# Patient Record
Sex: Female | Born: 1935 | Race: White | Hispanic: No | Marital: Married | Smoking: Never smoker
Health system: Southern US, Community
[De-identification: ages and names within clinical notes are randomized; demographics above are authoritative.]

## PROBLEM LIST (undated history)

## (undated) DIAGNOSIS — E7800 Pure hypercholesterolemia, unspecified: Secondary | ICD-10-CM

## (undated) DIAGNOSIS — F419 Anxiety disorder, unspecified: Secondary | ICD-10-CM

## (undated) DIAGNOSIS — I4891 Unspecified atrial fibrillation: Secondary | ICD-10-CM

## (undated) DIAGNOSIS — K219 Gastro-esophageal reflux disease without esophagitis: Secondary | ICD-10-CM

## (undated) DIAGNOSIS — G309 Alzheimer's disease, unspecified: Secondary | ICD-10-CM

## (undated) DIAGNOSIS — H539 Unspecified visual disturbance: Secondary | ICD-10-CM

## (undated) DIAGNOSIS — I10 Essential (primary) hypertension: Secondary | ICD-10-CM

## (undated) DIAGNOSIS — F0280 Dementia in other diseases classified elsewhere without behavioral disturbance: Secondary | ICD-10-CM

## (undated) HISTORY — PX: ABDOMINAL HYSTERECTOMY: SHX81

## (undated) HISTORY — PX: CATARACT EXTRACTION, BILATERAL: SHX1313

## (undated) HISTORY — DX: Unspecified visual disturbance: H53.9

## (undated) HISTORY — PX: TUBAL LIGATION: SHX77

## (undated) HISTORY — PX: BACK SURGERY: SHX140

---

## 2010-09-23 DIAGNOSIS — R011 Cardiac murmur, unspecified: Secondary | ICD-10-CM | POA: Insufficient documentation

## 2011-11-27 DIAGNOSIS — N281 Cyst of kidney, acquired: Secondary | ICD-10-CM | POA: Insufficient documentation

## 2011-12-14 DIAGNOSIS — M5414 Radiculopathy, thoracic region: Secondary | ICD-10-CM | POA: Insufficient documentation

## 2012-01-23 ENCOUNTER — Encounter (HOSPITAL_BASED_OUTPATIENT_CLINIC_OR_DEPARTMENT_OTHER): Payer: Self-pay | Admitting: Student

## 2012-01-23 ENCOUNTER — Emergency Department (HOSPITAL_BASED_OUTPATIENT_CLINIC_OR_DEPARTMENT_OTHER)
Admission: EM | Admit: 2012-01-23 | Discharge: 2012-01-23 | Disposition: A | Discharge status: Home / Self Care | Payer: Medicare Other | Attending: Emergency Medicine | Admitting: Emergency Medicine

## 2012-01-23 ENCOUNTER — Emergency Department (HOSPITAL_BASED_OUTPATIENT_CLINIC_OR_DEPARTMENT_OTHER): Payer: Medicare Other

## 2012-01-23 DIAGNOSIS — I4891 Unspecified atrial fibrillation: Secondary | ICD-10-CM | POA: Insufficient documentation

## 2012-01-23 DIAGNOSIS — E7800 Pure hypercholesterolemia, unspecified: Secondary | ICD-10-CM | POA: Insufficient documentation

## 2012-01-23 DIAGNOSIS — Y9301 Activity, walking, marching and hiking: Secondary | ICD-10-CM | POA: Insufficient documentation

## 2012-01-23 DIAGNOSIS — W010XXA Fall on same level from slipping, tripping and stumbling without subsequent striking against object, initial encounter: Secondary | ICD-10-CM | POA: Insufficient documentation

## 2012-01-23 DIAGNOSIS — F411 Generalized anxiety disorder: Secondary | ICD-10-CM | POA: Insufficient documentation

## 2012-01-23 DIAGNOSIS — Y92009 Unspecified place in unspecified non-institutional (private) residence as the place of occurrence of the external cause: Secondary | ICD-10-CM | POA: Insufficient documentation

## 2012-01-23 DIAGNOSIS — I10 Essential (primary) hypertension: Secondary | ICD-10-CM | POA: Insufficient documentation

## 2012-01-23 DIAGNOSIS — S81009A Unspecified open wound, unspecified knee, initial encounter: Secondary | ICD-10-CM | POA: Insufficient documentation

## 2012-01-23 DIAGNOSIS — Z23 Encounter for immunization: Secondary | ICD-10-CM | POA: Insufficient documentation

## 2012-01-23 DIAGNOSIS — S60229A Contusion of unspecified hand, initial encounter: Secondary | ICD-10-CM

## 2012-01-23 DIAGNOSIS — S61209A Unspecified open wound of unspecified finger without damage to nail, initial encounter: Secondary | ICD-10-CM | POA: Insufficient documentation

## 2012-01-23 DIAGNOSIS — IMO0002 Reserved for concepts with insufficient information to code with codable children: Secondary | ICD-10-CM

## 2012-01-23 DIAGNOSIS — Z79899 Other long term (current) drug therapy: Secondary | ICD-10-CM | POA: Insufficient documentation

## 2012-01-23 DIAGNOSIS — K219 Gastro-esophageal reflux disease without esophagitis: Secondary | ICD-10-CM | POA: Insufficient documentation

## 2012-01-23 HISTORY — DX: Anxiety disorder, unspecified: F41.9

## 2012-01-23 HISTORY — DX: Unspecified atrial fibrillation: I48.91

## 2012-01-23 HISTORY — DX: Pure hypercholesterolemia, unspecified: E78.00

## 2012-01-23 HISTORY — DX: Essential (primary) hypertension: I10

## 2012-01-23 HISTORY — DX: Gastro-esophageal reflux disease without esophagitis: K21.9

## 2012-01-23 MED ORDER — TETANUS-DIPHTH-ACELL PERTUSSIS 5-2.5-18.5 LF-MCG/0.5 IM SUSP
0.5000 mL | Freq: Once | INTRAMUSCULAR | Status: AC
  Administered 2012-01-23: 0.5 mL via INTRAMUSCULAR
  Filled 2012-01-23: qty 0.5

## 2012-01-23 NOTE — ED Provider Notes (Signed)
History     CSN: [id number]  Arrival date & time 01/23/12  1346   First MD Initiated Contact with Patient 01/23/12 1408      Chief Complaint  Patient presents with  . Hand Pain    right hand    (Consider location/radiation/quality/duration/timing/severity/associated sxs/prior treatment) HPI Comments: Patient presents with pain to her right hand after sustaining a fall 2 days ago. She was walking in her driveway and tripped fell over onto her left side. She did not hit her head. There is no loss of consciousness. She denies any neck or back pain. She complains of some pain to her left hand this been constant throbbing in nature. She also has a skin tear on her left ankle and right knee. She's unsure when her last tetanus shot was.  Patient is a 78 y.o. female presenting with hand pain.  Hand Pain Pertinent negatives include no chest pain, no abdominal pain, no headaches and no shortness of breath.    Past Medical History  Diagnosis Date  . Atrial fibrillation   . Anxiety   . Hypertension   . GERD (gastroesophageal reflux disease)   . Hypercholesteremia     Past Surgical History  Procedure Date  . Abdominal hysterectomy   . Cataract extraction, bilateral   . Tubal ligation     History reviewed. No pertinent family history.  History  Substance Use Topics  . Smoking status: Never Smoker   . Smokeless tobacco: Not on file  . Alcohol Use: No    OB History    Grav Para Term Preterm Abortions TAB SAB Ect Mult Living                  Review of Systems  Constitutional: Negative for fatigue.  HENT: Negative for neck pain.   Respiratory: Negative for shortness of breath.   Cardiovascular: Negative for chest pain.  Gastrointestinal: Negative for nausea, vomiting and abdominal pain.  Musculoskeletal: Negative for back pain and joint swelling.  Skin: Positive for wound.  Neurological: Negative for dizziness and headaches.    Allergies  Demerol and  Penicillins  Home Medications   Current Outpatient Rx  Name Route Sig Dispense Refill  . DILTIAZEM HCL ER BEADS 300 MG PO CP24 Oral Take 300 mg by mouth daily.    Marland Kitchen ESTROGENS CONJUGATED 0.3 MG PO TABS Oral Take 0.3 mg by mouth daily. Take daily for 21 days then do not take for 7 days.    Marland Kitchen HYDROCHLOROTHIAZIDE 25 MG PO TABS Oral Take 25 mg by mouth daily.    Marland Kitchen LORATADINE 10 MG PO TABS Oral Take 10 mg by mouth daily.    Marland Kitchen LORAZEPAM 0.5 MG PO TABS Oral Take 0.5 mg by mouth every 8 (eight) hours.    Marland Kitchen LOSARTAN POTASSIUM 100 MG PO TABS Oral Take 100 mg by mouth daily.    Marland Kitchen PIROXICAM 20 MG PO CAPS Oral Take 20 mg by mouth daily.    Marland Kitchen PRAVASTATIN SODIUM 20 MG PO TABS Oral Take 20 mg by mouth daily.      BP 145/58  Pulse 60  Temp 97.8 F (36.6 C) (Oral)  Resp 18  Wt 150 lb (68.04 kg)  SpO2 99%  Physical Exam  Constitutional: She appears well-developed and well-nourished.  HENT:  Head: Normocephalic.  Mouth/Throat: Oropharynx is clear and moist.  Neck: Normal range of motion. Neck supple.       No pain to the neck or back  Musculoskeletal:  She has some mild tenderness over the fifth metacarpal of the right hand. She has a small 1 cm skin tear over the volar surface of the base of the fifth digit of the right hand. She has a smaller wound over the distal phalanx of the fifth finger. She has also some skin tear overlying the right knee in the left ankle. There is no underlying bony tenderness. The wounds appear to be well healing with no signs of infection    ED Course  Procedures (including critical care time)  No results found for this or any previous visit. Dg Hand Complete Right  01/23/2012  *RADIOLOGY REPORT*  Clinical Data: Laceration post fall.  RIGHT HAND - COMPLETE 3+ VIEW  Comparison: None.  Findings: Mild diffuse osteopenia.  Narrowing of the articular cartilage at the STT articulation.  Negative for fracture, dislocation, or other acute bony abnormality.  Normal  alignment. No radiodense foreign body.  IMPRESSION:  1.  Negative for fracture or other acute bony abnormality. 2.  Osteopenia and STT degenerative changes as above.   Original Report Authenticated By: Osa Craver, M.D.       1. Hand contusion   2. Skin tear       MDM  Patient's tetanus shot was updated. This small wound on the right hand had a skin flap that was trimmed and the wound is redressed. Patient was given wound care and structures was advised to followup with her primary care physician as needed or return here as needed for any worsening symptoms        Rolan Bucco, MD 01/23/12 1443

## 2012-01-23 NOTE — ED Notes (Signed)
Pt in with c/o fall outside of house in drive way while ambulating and reports pain to right hand, right knee and hip pain. Denies LOC at onset and reports

## 2012-03-08 DIAGNOSIS — M5030 Other cervical disc degeneration, unspecified cervical region: Secondary | ICD-10-CM | POA: Insufficient documentation

## 2012-03-08 DIAGNOSIS — M48061 Spinal stenosis, lumbar region without neurogenic claudication: Secondary | ICD-10-CM | POA: Insufficient documentation

## 2012-06-15 DIAGNOSIS — Z9889 Other specified postprocedural states: Secondary | ICD-10-CM | POA: Insufficient documentation

## 2012-09-03 DIAGNOSIS — R5381 Other malaise: Secondary | ICD-10-CM | POA: Insufficient documentation

## 2012-09-03 DIAGNOSIS — M545 Low back pain, unspecified: Secondary | ICD-10-CM | POA: Insufficient documentation

## 2012-09-03 DIAGNOSIS — K5730 Diverticulosis of large intestine without perforation or abscess without bleeding: Secondary | ICD-10-CM | POA: Insufficient documentation

## 2012-09-03 DIAGNOSIS — M1990 Unspecified osteoarthritis, unspecified site: Secondary | ICD-10-CM | POA: Insufficient documentation

## 2012-09-03 DIAGNOSIS — K769 Liver disease, unspecified: Secondary | ICD-10-CM | POA: Insufficient documentation

## 2012-09-03 DIAGNOSIS — R609 Edema, unspecified: Secondary | ICD-10-CM | POA: Insufficient documentation

## 2012-09-03 DIAGNOSIS — R002 Palpitations: Secondary | ICD-10-CM | POA: Insufficient documentation

## 2012-09-03 DIAGNOSIS — F329 Major depressive disorder, single episode, unspecified: Secondary | ICD-10-CM | POA: Insufficient documentation

## 2012-09-03 DIAGNOSIS — H9319 Tinnitus, unspecified ear: Secondary | ICD-10-CM | POA: Insufficient documentation

## 2012-09-03 DIAGNOSIS — M47812 Spondylosis without myelopathy or radiculopathy, cervical region: Secondary | ICD-10-CM | POA: Insufficient documentation

## 2012-09-03 DIAGNOSIS — M47817 Spondylosis without myelopathy or radiculopathy, lumbosacral region: Secondary | ICD-10-CM | POA: Insufficient documentation

## 2012-09-03 DIAGNOSIS — G4710 Hypersomnia, unspecified: Secondary | ICD-10-CM | POA: Insufficient documentation

## 2012-09-03 DIAGNOSIS — K219 Gastro-esophageal reflux disease without esophagitis: Secondary | ICD-10-CM | POA: Insufficient documentation

## 2012-09-03 DIAGNOSIS — R000 Tachycardia, unspecified: Secondary | ICD-10-CM | POA: Insufficient documentation

## 2012-09-03 DIAGNOSIS — M4802 Spinal stenosis, cervical region: Secondary | ICD-10-CM | POA: Insufficient documentation

## 2012-09-03 DIAGNOSIS — E042 Nontoxic multinodular goiter: Secondary | ICD-10-CM | POA: Insufficient documentation

## 2012-09-03 DIAGNOSIS — F32A Depression, unspecified: Secondary | ICD-10-CM | POA: Insufficient documentation

## 2012-09-03 DIAGNOSIS — Z79899 Other long term (current) drug therapy: Secondary | ICD-10-CM | POA: Insufficient documentation

## 2012-09-22 DIAGNOSIS — R209 Unspecified disturbances of skin sensation: Secondary | ICD-10-CM | POA: Insufficient documentation

## 2012-10-28 DIAGNOSIS — G5600 Carpal tunnel syndrome, unspecified upper limb: Secondary | ICD-10-CM | POA: Insufficient documentation

## 2013-01-06 DIAGNOSIS — R413 Other amnesia: Secondary | ICD-10-CM | POA: Insufficient documentation

## 2013-01-06 DIAGNOSIS — N951 Menopausal and female climacteric states: Secondary | ICD-10-CM | POA: Insufficient documentation

## 2013-02-08 DIAGNOSIS — E785 Hyperlipidemia, unspecified: Secondary | ICD-10-CM | POA: Insufficient documentation

## 2013-02-08 DIAGNOSIS — I10 Essential (primary) hypertension: Secondary | ICD-10-CM | POA: Insufficient documentation

## 2013-05-17 DIAGNOSIS — E7800 Pure hypercholesterolemia, unspecified: Secondary | ICD-10-CM | POA: Insufficient documentation

## 2013-05-17 DIAGNOSIS — M51379 Other intervertebral disc degeneration, lumbosacral region without mention of lumbar back pain or lower extremity pain: Secondary | ICD-10-CM | POA: Insufficient documentation

## 2013-05-17 DIAGNOSIS — D179 Benign lipomatous neoplasm, unspecified: Secondary | ICD-10-CM | POA: Insufficient documentation

## 2013-05-17 DIAGNOSIS — I10 Essential (primary) hypertension: Secondary | ICD-10-CM | POA: Insufficient documentation

## 2013-05-17 DIAGNOSIS — M25559 Pain in unspecified hip: Secondary | ICD-10-CM | POA: Insufficient documentation

## 2013-05-17 DIAGNOSIS — D509 Iron deficiency anemia, unspecified: Secondary | ICD-10-CM | POA: Insufficient documentation

## 2013-05-17 DIAGNOSIS — F411 Generalized anxiety disorder: Secondary | ICD-10-CM | POA: Insufficient documentation

## 2013-05-17 DIAGNOSIS — M5137 Other intervertebral disc degeneration, lumbosacral region: Secondary | ICD-10-CM | POA: Insufficient documentation

## 2013-11-03 DIAGNOSIS — R51 Headache: Secondary | ICD-10-CM

## 2013-11-03 DIAGNOSIS — R519 Headache, unspecified: Secondary | ICD-10-CM | POA: Insufficient documentation

## 2013-11-15 DIAGNOSIS — R197 Diarrhea, unspecified: Secondary | ICD-10-CM | POA: Insufficient documentation

## 2013-11-15 DIAGNOSIS — R1031 Right lower quadrant pain: Secondary | ICD-10-CM | POA: Insufficient documentation

## 2013-11-15 DIAGNOSIS — M542 Cervicalgia: Secondary | ICD-10-CM | POA: Insufficient documentation

## 2013-11-15 DIAGNOSIS — L570 Actinic keratosis: Secondary | ICD-10-CM | POA: Insufficient documentation

## 2014-02-06 DIAGNOSIS — K648 Other hemorrhoids: Secondary | ICD-10-CM | POA: Insufficient documentation

## 2014-02-06 DIAGNOSIS — K635 Polyp of colon: Secondary | ICD-10-CM | POA: Insufficient documentation

## 2014-02-07 DIAGNOSIS — N189 Chronic kidney disease, unspecified: Secondary | ICD-10-CM | POA: Insufficient documentation

## 2014-06-06 DIAGNOSIS — R140 Abdominal distension (gaseous): Secondary | ICD-10-CM | POA: Insufficient documentation

## 2014-07-18 DIAGNOSIS — R197 Diarrhea, unspecified: Secondary | ICD-10-CM | POA: Insufficient documentation

## 2014-08-22 DIAGNOSIS — G301 Alzheimer's disease with late onset: Principal | ICD-10-CM

## 2014-08-22 DIAGNOSIS — F0280 Dementia in other diseases classified elsewhere without behavioral disturbance: Secondary | ICD-10-CM | POA: Insufficient documentation

## 2014-08-22 DIAGNOSIS — F0390 Unspecified dementia without behavioral disturbance: Secondary | ICD-10-CM | POA: Insufficient documentation

## 2014-09-06 DIAGNOSIS — K5790 Diverticulosis of intestine, part unspecified, without perforation or abscess without bleeding: Secondary | ICD-10-CM | POA: Insufficient documentation

## 2014-09-06 DIAGNOSIS — K5900 Constipation, unspecified: Secondary | ICD-10-CM | POA: Insufficient documentation

## 2014-09-11 ENCOUNTER — Ambulatory Visit (INDEPENDENT_AMBULATORY_CARE_PROVIDER_SITE_OTHER): Payer: Medicare Other | Admitting: Neurology

## 2014-09-11 ENCOUNTER — Encounter: Payer: Self-pay | Admitting: Neurology

## 2014-09-11 VITALS — BP 170/68 | HR 68 | Resp 16 | Ht 64.0 in | Wt 165.0 lb

## 2014-09-11 DIAGNOSIS — E559 Vitamin D deficiency, unspecified: Secondary | ICD-10-CM | POA: Insufficient documentation

## 2014-09-11 DIAGNOSIS — F0280 Dementia in other diseases classified elsewhere without behavioral disturbance: Secondary | ICD-10-CM

## 2014-09-11 DIAGNOSIS — G301 Alzheimer's disease with late onset: Secondary | ICD-10-CM | POA: Diagnosis not present

## 2014-09-11 DIAGNOSIS — F32A Depression, unspecified: Secondary | ICD-10-CM | POA: Insufficient documentation

## 2014-09-11 DIAGNOSIS — F329 Major depressive disorder, single episode, unspecified: Secondary | ICD-10-CM | POA: Diagnosis not present

## 2014-09-11 DIAGNOSIS — M47817 Spondylosis without myelopathy or radiculopathy, lumbosacral region: Secondary | ICD-10-CM

## 2014-09-11 MED ORDER — ESCITALOPRAM OXALATE 10 MG PO TABS: 10.0000 mg | ORAL_TABLET | Freq: Every day | ORAL | Status: DC

## 2014-09-11 NOTE — Progress Notes (Signed)
[facility]  PATIENT: Patricia Cline DOB: [birth year]  REFERRING DOCTOR OR PCP:  Tawanna Cooler SOURCE: Notes from Dr. Ishmael Holter, patient and family  _________________________________   HISTORICAL  CHIEF COMPLAINT:  Chief Complaint  Patient presents with  . Memory Loss    Patricia Cline is a former pt. of Dr. Garth Bigness from [facility], where he saw her for back pain.  Today she is here with new c/o memory loss for the last several yrs., worse in the last 6 mos.  Per husband, she stopped driving 4 yrs. ago because she was getting lost.  Now she c/o difficulty finding the right words during conversation, misplaces objects around the house.  Sts. she completed a memory test with Dr. Ishmael Holter and then was referred here to see Dr. Arlean Hopping    HISTORY OF PRESENT ILLNESS:  Patricia Cline is an 81 year old woman who has had difficulties with her memory for the past 3 or 4 years. Her husband noted that she got lost while driving around at that time and continued to have some difficulty with other cognitive tasks. A few years ago, it was more obvious that she was having difficulties with memory and she has since stopped driving.    She is currently having difficulties with short-term memory, verbal fluency, misplacing items around the house and other cognitive tasks.  She was started on Namenda a couple weeks ago and is tolerating it well. Due to GI issues, Aricept has not been tried yet.   She is to undergo further GI testing this month.  She notes that she has had some crying spells and the family believes that these are occurring more the past year than they had previously. She is not on any antidepressant medicine.  She sleeps well most nights, often getting up once to use the bathroom. She falls asleep quickly at night. Her husband notes that she will quickly fall back asleep if she gets up in the night. She does not snore. He has not noted any pauses in her breathing.  She has not had any  imaging study of the brain. I reviewed notes and labs from Dr. Ishmael Holter.  I reviewed the results of the [place] cognitive assessment her, her husband, daughter and granddaughter. She scored only and 9/30 losing all 5 points for visual spatial and executive function, losing 4 points for attention, losing 1 point for verbal fluency, 2 points for abstract thought, 4 points for delayed recall and 5 points for orientation.   I discussed with them that her performance is very consistent with a diagnosis of Alzheimer's disease. I did note that with cues her delayed recall did better, which could imply that poor focus is playing some role in her poor cognitive function.   Mood and poor sleep or to possible causes of reduced focusing  In the past, I also saw her for lower back pain and lumbar spondylosis. These issues are stable and the pain only bothers her intermittently.  REVIEW OF SYSTEMS: Constitutional: No fevers, chills, sweats, or change in appetite Eyes: No visual changes, double vision, eye pain Ear, nose and throat: No hearing loss, ear pain, nasal congestion, sore throat Cardiovascular: No chest pain, palpitations Respiratory: No shortness of breath at rest or with exertion.   No wheezes GastrointestinaI: No nausea, vomiting, diarrhea, abdominal pain, fecal incontinence Genitourinary: No dysuria, urinary retention or frequency.  No nocturia. Musculoskeletal: No neck pain, back pain Integumentary: No rash, pruritus, skin lesions Neurological: as above Psychiatric: No depression at  this time.  No anxiety Endocrine: No palpitations, diaphoresis, change in appetite, change in weigh or increased thirst Hematologic/Lymphatic: No anemia, purpura, petechiae. Allergic/Immunologic: No itchy/runny eyes, nasal congestion, recent allergic reactions, rashes  ALLERGIES: Allergies  Allergen Reactions  . Demerol [Meperidine]   . Nebivolol Other (See Comments)    Other reaction(s): Other (See  Comments) unknown  . Nebivolol Hcl Other (See Comments)    unknown  . Penicillins   . Meloxicam Nausea Only    Possible intolerance-patient back to DC med    HOME MEDICATIONS:  Current outpatient prescriptions:  .  ARTIFICIAL TEARS 0.1-0.3 % SOLN, 1 drop., Disp: , Rfl:  .  aspirin EC 81 MG tablet, Take 81 mg by mouth., Disp: , Rfl:  .  docusate sodium (COLACE) 100 MG capsule, Take 100 mg by mouth., Disp: , Rfl:  .  gabapentin (NEURONTIN) 600 MG tablet, HOLD starting 02/07/14 until further notice., Disp: , Rfl:  .  loratadine (CLARITIN) 10 MG tablet, Take 10 mg by mouth., Disp: , Rfl:  .  losartan (COZAAR) 50 MG tablet, TAKE 1 BY MOUTH EVERY MORNING FOR BLOOD PRESSURE *STOP LOSARTAN 100MG 1 EVERY MORNING*, Disp: , Rfl:  .  meloxicam (MOBIC) 7.5 MG tablet, Hold medications until at least 12/11/13., Disp: , Rfl:  .  memantine (NAMENDA) 5 MG tablet, Take 5 mg by mouth., Disp: , Rfl:  .  metoprolol tartrate (LOPRESSOR) 25 MG tablet, Take 50 mg by mouth., Disp: , Rfl:  .  omeprazole (PRILOSEC) 40 MG capsule, TAKE 1 CAPSULE EVERY DAY IN THE MORNING 30 MINUTES BEFORE BREAKFAST FOR GERD., Disp: , Rfl:  .  pravastatin (PRAVACHOL) 20 MG tablet, Take 20 mg by mouth daily., Disp: , Rfl:  .  diltiazem (TIAZAC) 300 MG 24 hr capsule, Take 300 mg by mouth daily., Disp: , Rfl:  .  estrogens, conjugated, (PREMARIN) 0.3 MG tablet, Take 0.3 mg by mouth daily. Take daily for 21 days then do not take for 7 days., Disp: , Rfl:  .  hydrochlorothiazide (HYDRODIURIL) 25 MG tablet, Take 25 mg by mouth daily., Disp: , Rfl:   PAST MEDICAL HISTORY: Past Medical History  Diagnosis Date  . Atrial fibrillation   . Anxiety   . Hypertension   . GERD (gastroesophageal reflux disease)   . Hypercholesteremia     PAST SURGICAL HISTORY: Past Surgical History  Procedure Laterality Date  . Abdominal hysterectomy    . Cataract extraction, bilateral    . Tubal ligation      FAMILY HISTORY: No family history on  file.  SOCIAL HISTORY:  History   Social History  . Marital Status: Married    Spouse Name: N/A  . Number of Children: N/A  . Years of Education: N/A   Occupational History  . Not on file.   Social History Main Topics  . Smoking status: Never Smoker   . Smokeless tobacco: Not on file  . Alcohol Use: No  . Drug Use: Not on file  . Sexual Activity: Not on file   Other Topics Concern  . Not on file   Social History Narrative     PHYSICAL EXAM  Filed Vitals:   09/11/14 1327  BP: 170/68  Pulse: 68  Resp: 16  Height: _0  (1.626 m)  Weight: 165 lb (74.844 kg)    Body mass index is 28.31 kg/(m^2).   General: The patient is well-developed and well-nourished and in no acute distress  Eyes:  Funduscopic exam shows normal optic discs  and retinal vessels.  Neck: The neck is supple, no carotid bruits are noted.  The neck is nontender.  Cardiovascular: The heart has a regular rate and rhythm with a normal S1 and S2. There were no murmurs, gallops or rubs.   Skin: Extremities are without significant edema.  Musculoskeletal:  Back is nontender  Neurologic Exam  Mental status: She has reduced visual spatial, executive function, attention/focus, short-term memory (see HPI MoCA results for details). She is oriented Only to name.   Speech is normal.  Cranial nerves: Extraocular movements are full. Pupils are equal, round, and reactive to light and accomodation.  Visual fields are full.  Facial symmetry is present. There is good facial sensation to soft touch bilaterally.Facial strength is normal.  Trapezius and sternocleidomastoid strength is normal. No dysarthria is noted.  The tongue is midline, and the patient has symmetric elevation of the soft palate. No obvious hearing deficits are noted.  Motor:  Muscle bulk is normal.   Tone is normal. Strength is  5 / 5 in all 4 extremities.   Sensory: Sensory testing is intact to pinprick, soft touch and vibration sensation in  all 4 extremities.  Coordination: Cerebellar testing reveals good finger-nose-finger and heel-to-shin bilaterally.  Gait and station: Station is normal.   Gait is normal. Tandem gait is normal for age. Romberg is negative.   Reflexes: Deep tendon reflexes are symmetric and normal bilaterally.   Plantar responses are flexor.    DIAGNOSTIC DATA (LABS, IMAGING, TESTING) - I reviewed patient records, labs, notes, testing and imaging myself where available.    ASSESSMENT AND PLAN  Dementia in Alzheimer's disease with late onset - Plan: Sedimentation rate, Vit D  25 hydroxy (rtn osteoporosis monitoring), Vitamin B12, MR Brain Wo Contrast  Vitamin D deficiency - Plan: Sedimentation rate, Vit D  25 hydroxy (rtn osteoporosis monitoring), Vitamin B12  Lumbosacral spondylosis  Depression   In summary, Suly Vukelich is an 81 year old woman with cognitive dysfunction most consistent with Alzheimer's disease I believe she also has a depression that may be contributing somewhat to her cognitive function. I will start her on escitalopram 10 mg. She is undergoing a GI workup and I will hold off on the Aricept until the workup is complete.     I had a long discussion with her, her husband, daughter and granddaughter about Alzheimer's disease, prognosis and the impact that other medical issues such as depression, metabolic/nutritional issues  and poor sleep can play on cognitive function.    We will check a noncontrasted MRI of the head to make sure that there is notcause such as bilateral subdural hematomas, hygromas or hydrocephalus. This will also allow [place] to determine if she has superimposed small vessel ischemic changes that may also be worsening cognition. We'll check an ESR, vitamin D and B12 to rule out treatable causes of cognitive dysfunction.  She will return to see me in 3-4 months or sooner if she has new or worsening neurologic symptoms.  50 minute face-to-face evaluation with greater  than one half of the time and coordinating care for her cognitive dysfunction      Murel Wigle A. Felecia Shelling, MD, PhD 7/58/8325, 4:98 PM Certified in Neurology, Clinical Neurophysiology, Sleep Medicine, Pain Medicine and Neuroimaging  [facility] [address], [place], [place] [address] [phone]

## 2014-09-12 ENCOUNTER — Telehealth: Payer: Self-pay | Admitting: *Deleted

## 2014-09-12 LAB — VITAMIN D 25 HYDROXY (VIT D DEFICIENCY, FRACTURES): VIT D 25 HYDROXY: 20.8 ng/mL — AB (ref 30.0–100.0)

## 2014-09-12 LAB — VITAMIN B12: Vitamin B-12: 503 pg/mL (ref 211–946)

## 2014-09-12 LAB — SEDIMENTATION RATE: Sed Rate: 4 mm/hr (ref 0–40)

## 2014-09-12 MED ORDER — VITAMIN D (ERGOCALCIFEROL) 1.25 MG (50000 UNIT) PO CAPS: 50000.0000 [IU] | ORAL_CAPSULE | ORAL | Status: AC

## 2014-09-12 NOTE — Telephone Encounter (Signed)
-----   Message from Britt Bottom, MD sent at 09/12/2014  8:35 AM EDT ----- Vit D is low     50000 x 12 weeks weekly   Then 4000-5000 U OTC daily

## 2014-09-12 NOTE — Telephone Encounter (Signed)
I spoke with husband Keith--he sts.  he is driving right now and would like me to call him back at a later time with lab results./fim

## 2014-09-12 NOTE — Telephone Encounter (Signed)
Husband returned call. Please call and advise.

## 2014-09-12 NOTE — Telephone Encounter (Signed)
I have spoken with Patricia Cline and per RAS, advised that [name]'s vit. d level is low--advised him of the need for rx. vit. d 50,000iu weekly for 3 mos, then otc vit. d 5,000iu daily.  He verbalized understanding of same.  Rx. escribed to [facility] on [address] per his request/fim

## 2014-09-20 ENCOUNTER — Telehealth: Payer: Self-pay | Admitting: Neurology

## 2014-09-20 MED ORDER — BUPROPION HCL ER (XL) 150 MG PO TB24: 150.0000 mg | ORAL_TABLET | Freq: Every day | ORAL | Status: AC

## 2014-09-20 NOTE — Telephone Encounter (Signed)
Patients husband called and requested to speak with the nurse regarding the mediation Rx. escitalopram (LEXAPRO) 10 MG tablet. He states that it is making the patient extremely tired and she is falling asleep throughout the day and is very disoriented. Please call and advise.

## 2014-09-20 NOTE — Telephone Encounter (Signed)
I have spoken with Patricia Cline this morning and per RAS ,offered change from Lexapro to Wellbutrin XR 150mg  po daily, and hopefully she will not have drowsiness with this.  He is agreeable.  Instructions to stop Lexapro have been given and Wellbutrin rx. has been escribed to [facility] per his request/fim

## 2014-09-22 ENCOUNTER — Ambulatory Visit
Admission: RE | Admit: 2014-09-22 | Discharge: 2014-09-22 | Disposition: A | Discharge status: Home / Self Care | Payer: Medicare Other | Source: Ambulatory Visit | Attending: Neurology | Admitting: Neurology

## 2014-09-22 DIAGNOSIS — G301 Alzheimer's disease with late onset: Principal | ICD-10-CM

## 2014-09-22 DIAGNOSIS — F0280 Dementia in other diseases classified elsewhere without behavioral disturbance: Secondary | ICD-10-CM

## 2014-09-25 ENCOUNTER — Telehealth: Payer: Self-pay | Admitting: *Deleted

## 2014-09-25 NOTE — Telephone Encounter (Signed)
-----   Message from Britt Bottom, MD sent at 09/24/2014  5:10 PM EDT ----- Please let the patient's family note that the MRI does show atrophy that is consistent with Alzheimer's disease.

## 2014-09-25 NOTE — Telephone Encounter (Signed)
I have spoken with Patricia Cline this afternoon and per RAS, advised that mri shows changes consistent with AD.  He verbalized understanding of same.  I have confirmed f/u appt. and he will call if problems arise prior to appt./fim

## 2014-11-17 ENCOUNTER — Encounter (HOSPITAL_BASED_OUTPATIENT_CLINIC_OR_DEPARTMENT_OTHER): Payer: Self-pay | Admitting: Emergency Medicine

## 2014-11-17 ENCOUNTER — Emergency Department (HOSPITAL_BASED_OUTPATIENT_CLINIC_OR_DEPARTMENT_OTHER): Payer: Medicare Other

## 2014-11-17 ENCOUNTER — Emergency Department (HOSPITAL_BASED_OUTPATIENT_CLINIC_OR_DEPARTMENT_OTHER)
Admission: EM | Admit: 2014-11-17 | Discharge: 2014-11-18 | Disposition: A | Discharge status: Home / Self Care | Payer: Medicare Other | Attending: Emergency Medicine | Admitting: Emergency Medicine

## 2014-11-17 DIAGNOSIS — Z8669 Personal history of other diseases of the nervous system and sense organs: Secondary | ICD-10-CM | POA: Diagnosis not present

## 2014-11-17 DIAGNOSIS — E780 Pure hypercholesterolemia: Secondary | ICD-10-CM | POA: Diagnosis not present

## 2014-11-17 DIAGNOSIS — Z7982 Long term (current) use of aspirin: Secondary | ICD-10-CM | POA: Diagnosis not present

## 2014-11-17 DIAGNOSIS — F419 Anxiety disorder, unspecified: Secondary | ICD-10-CM | POA: Insufficient documentation

## 2014-11-17 DIAGNOSIS — R1030 Lower abdominal pain, unspecified: Secondary | ICD-10-CM | POA: Diagnosis not present

## 2014-11-17 DIAGNOSIS — I4891 Unspecified atrial fibrillation: Secondary | ICD-10-CM | POA: Insufficient documentation

## 2014-11-17 DIAGNOSIS — K219 Gastro-esophageal reflux disease without esophagitis: Secondary | ICD-10-CM | POA: Insufficient documentation

## 2014-11-17 DIAGNOSIS — K529 Noninfective gastroenteritis and colitis, unspecified: Secondary | ICD-10-CM | POA: Diagnosis not present

## 2014-11-17 DIAGNOSIS — I10 Essential (primary) hypertension: Secondary | ICD-10-CM | POA: Diagnosis not present

## 2014-11-17 DIAGNOSIS — Z880 Allergy status to penicillin: Secondary | ICD-10-CM | POA: Diagnosis not present

## 2014-11-17 DIAGNOSIS — R300 Dysuria: Secondary | ICD-10-CM | POA: Diagnosis present

## 2014-11-17 DIAGNOSIS — Z79899 Other long term (current) drug therapy: Secondary | ICD-10-CM | POA: Diagnosis not present

## 2014-11-17 HISTORY — DX: Dementia in other diseases classified elsewhere, unspecified severity, without behavioral disturbance, psychotic disturbance, mood disturbance, and anxiety: F02.80

## 2014-11-17 HISTORY — DX: Alzheimer's disease, unspecified: G30.9

## 2014-11-17 LAB — COMPREHENSIVE METABOLIC PANEL
ALBUMIN: 3.9 g/dL (ref 3.5–5.0)
ALK PHOS: 97 U/L (ref 38–126)
ALT: 26 U/L (ref 14–54)
ANION GAP: 9 (ref 5–15)
AST: 23 U/L (ref 15–41)
BILIRUBIN TOTAL: 0.4 mg/dL (ref 0.3–1.2)
BUN: 19 mg/dL (ref 6–20)
CALCIUM: 10.4 mg/dL — AB (ref 8.9–10.3)
CO2: 21 mmol/L — ABNORMAL LOW (ref 22–32)
Chloride: 102 mmol/L (ref 101–111)
Creatinine, Ser: 1.5 mg/dL — ABNORMAL HIGH (ref 0.44–1.00)
GFR, EST AFRICAN AMERICAN: 37 mL/min — AB (ref >60)
GFR, EST NON AFRICAN AMERICAN: 32 mL/min — AB (ref >60)
GLUCOSE: 117 mg/dL — AB (ref 65–99)
POTASSIUM: 4.6 mmol/L (ref 3.5–5.1)
Sodium: 132 mmol/L — ABNORMAL LOW (ref 135–145)
TOTAL PROTEIN: 7.1 g/dL (ref 6.5–8.1)

## 2014-11-17 LAB — URINE MICROSCOPIC-ADD ON

## 2014-11-17 LAB — URINALYSIS, ROUTINE W REFLEX MICROSCOPIC
Glucose, UA: NEGATIVE mg/dL
HGB URINE DIPSTICK: NEGATIVE
Ketones, ur: 15 mg/dL — AB
NITRITE: NEGATIVE
PROTEIN: NEGATIVE mg/dL
SPECIFIC GRAVITY, URINE: 1.02 (ref 1.005–1.030)
UROBILINOGEN UA: 1 mg/dL (ref 0.0–1.0)
pH: 5.5 (ref 5.0–8.0)

## 2014-11-17 LAB — CBC
HEMATOCRIT: 37 % (ref 36.0–46.0)
HEMOGLOBIN: 12.3 g/dL (ref 12.0–15.0)
MCH: 30.9 pg (ref 26.0–34.0)
MCHC: 33.2 g/dL (ref 30.0–36.0)
MCV: 93 fL (ref 78.0–100.0)
Platelets: 317 10*3/uL (ref 150–400)
RBC: 3.98 MIL/uL (ref 3.87–5.11)
RDW: 12.9 % (ref 11.5–15.5)
WBC: 7.3 10*3/uL (ref 4.0–10.5)

## 2014-11-17 LAB — LIPASE, BLOOD: Lipase: 57 U/L — ABNORMAL HIGH (ref 22–51)

## 2014-11-17 MED ORDER — METRONIDAZOLE 500 MG PO TABS
500.0000 mg | ORAL_TABLET | Freq: Once | ORAL | Status: AC
  Administered 2014-11-18: 500 mg via ORAL
  Filled 2014-11-17: qty 1

## 2014-11-17 MED ORDER — CIPROFLOXACIN HCL 500 MG PO TABS
500.0000 mg | ORAL_TABLET | Freq: Once | ORAL | Status: AC
  Administered 2014-11-18: 500 mg via ORAL
  Filled 2014-11-17: qty 1

## 2014-11-17 MED ORDER — IOHEXOL 300 MG/ML  SOLN
25.0000 mL | Freq: Once | INTRAMUSCULAR | Status: AC | PRN
  Administered 2014-11-17: 25 mL via ORAL

## 2014-11-17 MED ORDER — CIPROFLOXACIN HCL 500 MG PO TABS: 500.0000 mg | ORAL_TABLET | Freq: Two times a day (BID) | ORAL | Status: AC

## 2014-11-17 MED ORDER — IOHEXOL 300 MG/ML  SOLN
100.0000 mL | Freq: Once | INTRAMUSCULAR | Status: AC | PRN
  Administered 2014-11-17: 80 mL via INTRAVENOUS

## 2014-11-17 MED ORDER — LORAZEPAM 1 MG PO TABS
0.5000 mg | ORAL_TABLET | Freq: Once | ORAL | Status: AC
  Administered 2014-11-17: 0.5 mg via ORAL
  Filled 2014-11-17: qty 1

## 2014-11-17 MED ORDER — ONDANSETRON 4 MG PO TBDP
4.0000 mg | ORAL_TABLET | Freq: Once | ORAL | Status: AC | PRN
  Administered 2014-11-17: 4 mg via ORAL
  Filled 2014-11-17: qty 1

## 2014-11-17 MED ORDER — METRONIDAZOLE 500 MG PO TABS: 500.0000 mg | ORAL_TABLET | Freq: Three times a day (TID) | ORAL | Status: AC

## 2014-11-17 NOTE — ED Notes (Signed)
81 yo with burning and abdominal cramping with urination. Recently dx with UTI and has finished course of antibiotics 2 days ago. Pt states she is also nauseated.

## 2014-11-17 NOTE — ED Provider Notes (Signed)
CSN: [id number]     Arrival date & time 11/17/14  1851 History   First MD Initiated Contact with Patient 11/17/14 2118     Chief Complaint  Patient presents with  . Dysuria  . Nausea     (Consider location/radiation/quality/duration/timing/severity/associated sxs/prior Treatment) HPI Comments: Patient is an 81 year old female with history of dementia. She also has an extensive history of gastrointestinal issues that have been worked up at [facility]. She is brought today for evaluation of sudden onset lower abdominal pain this morning with associated urinary burning and frequency. Patient has little to history due to her dementia. The majority of the information obtained and the history was acquired from the husband and daughter who are at bedside. There've been no reported fevers  Patient is a 81 y.o. female presenting with dysuria. The history is provided by the patient and a relative.  Dysuria Pain quality:  Burning Pain severity:  Moderate Onset quality:  Sudden Duration:  2 days Timing:  Constant Progression:  Worsening Chronicity:  New Recent urinary tract infections: yes   Relieved by:  Nothing Worsened by:  Nothing tried Ineffective treatments:  None tried   Past Medical History  Diagnosis Date  . Atrial fibrillation   . Anxiety   . Hypertension   . GERD (gastroesophageal reflux disease)   . Hypercholesteremia   . Vision abnormalities    Past Surgical History  Procedure Laterality Date  . Abdominal hysterectomy    . Cataract extraction, bilateral    . Tubal ligation    . Back surgery     Family History  Problem Relation Age of Onset  . Congestive Heart Failure Mother    Social History  Substance Use Topics  . Smoking status: Never Smoker   . Smokeless tobacco: None  . Alcohol Use: No   OB History    No data available     Review of Systems  Genitourinary: Positive for dysuria.  All other systems reviewed and are negative.     Allergies  Demerol;  Nebivolol; Nebivolol hcl; Penicillins; and Meloxicam  Home Medications   Prior to Admission medications   Medication Sig Start Date End Date Taking? Authorizing Provider  ARTIFICIAL TEARS 0.1-0.3 % SOLN 1 drop.    Historical Provider, MD  aspirin EC 81 MG tablet Take 81 mg by mouth. 01/01/10   Historical Provider, MD  buPROPion (WELLBUTRIN XL) 150 MG 24 hr tablet Take 1 tablet (150 mg total) by mouth daily. 09/20/14   Britt Bottom, MD  diltiazem (TIAZAC) 300 MG 24 hr capsule Take 300 mg by mouth daily.    Historical Provider, MD  docusate sodium (COLACE) 100 MG capsule Take 100 mg by mouth.    Historical Provider, MD  estrogens, conjugated, (PREMARIN) 0.3 MG tablet Take 0.3 mg by mouth daily. Take daily for 21 days then do not take for 7 days.    Historical Provider, MD  gabapentin (NEURONTIN) 600 MG tablet HOLD starting 02/07/14 until further notice. 02/07/14   Historical Provider, MD  hydrochlorothiazide (HYDRODIURIL) 25 MG tablet Take 25 mg by mouth daily.    Historical Provider, MD  loratadine (CLARITIN) 10 MG tablet Take 10 mg by mouth. 09/26/13   Historical Provider, MD  losartan (COZAAR) 50 MG tablet TAKE 1 BY MOUTH EVERY MORNING FOR BLOOD PRESSURE *STOP LOSARTAN 100MG  1 EVERY MORNING* 07/17/14   Historical Provider, MD  meloxicam (MOBIC) 7.5 MG tablet Hold medications until at least 12/11/13. 11/15/13   Historical Provider, MD  memantine [facility])  5 MG tablet Take 5 mg by mouth. 08/22/14 08/22/15  Historical Provider, MD  metoprolol tartrate (LOPRESSOR) 25 MG tablet Take 50 mg by mouth. 02/07/14 02/08/15  Historical Provider, MD  omeprazole (PRILOSEC) 40 MG capsule TAKE 1 CAPSULE EVERY DAY IN THE MORNING 30 MINUTES BEFORE BREAKFAST FOR GERD. 04/11/14 04/12/15  Historical Provider, MD  pravastatin (PRAVACHOL) 20 MG tablet Take 20 mg by mouth daily.    Historical Provider, MD  Vitamin D, Ergocalciferol, (DRISDOL) 50000 UNITS CAPS capsule Take 1 capsule (50,000 Units total) by mouth every 7  (seven) days. 09/12/14   Britt Bottom, MD   BP 165/67 mmHg  Pulse 71  Temp(Src) 98.1 F (36.7 C) (Oral)  Resp 21  Ht 5\' 4"  (1.626 m)  Wt 160 lb (72.576 kg)  BMI 27.45 kg/m2  SpO2 98% Physical Exam  Constitutional: She is oriented to person, place, and time. She appears well-developed and well-nourished. No distress.  HENT:  Head: Normocephalic and atraumatic.  Neck: Normal range of motion. Neck supple.  Cardiovascular: Normal rate and regular rhythm.  Exam reveals no gallop and no friction rub.   No murmur heard. Pulmonary/Chest: Effort normal and breath sounds normal. No respiratory distress. She has no wheezes.  Abdominal: Soft. Bowel sounds are normal. She exhibits no distension. There is tenderness. There is no rebound and no guarding.  There is mild tenderness to palpation in the suprapubic region.  Musculoskeletal: Normal range of motion.  Neurological: She is alert and oriented to person, place, and time.  Skin: Skin is warm and dry. She is not diaphoretic.  Nursing note and vitals reviewed.   ED Course  Procedures (including critical care time) Labs Review Labs Reviewed  LIPASE, BLOOD - Abnormal; Notable for the following:    Lipase 57 (*)    All other components within normal limits  COMPREHENSIVE METABOLIC PANEL - Abnormal; Notable for the following:    Sodium 132 (*)    CO2 21 (*)    Glucose, Bld 117 (*)    Creatinine, Ser 1.50 (*)    Calcium 10.4 (*)    GFR calc non Af Amer 32 (*)    GFR calc Af Amer 37 (*)    All other components within normal limits  URINALYSIS, ROUTINE W REFLEX MICROSCOPIC (NOT AT [facility]) - Abnormal; Notable for the following:    Color, Urine AMBER (*)    APPearance CLOUDY (*)    Bilirubin Urine SMALL (*)    Ketones, ur 15 (*)    Leukocytes, UA SMALL (*)    All other components within normal limits  URINE MICROSCOPIC-ADD ON - Abnormal; Notable for the following:    Bacteria, UA FEW (*)    All other components within normal limits   CBC    Imaging Review No results found. I have personally reviewed and evaluated these images and lab results as part of my medical decision-making.   EKG Interpretation None      MDM   Final diagnoses:  None    Patient presents with lower abdominal pain. She also had an episode of diarrhea after arriving here. She has been on several antibiotics recently for urinary tract infections. Her CT scan shows colitis of the descending colon which I highly suspect his C. difficile. She will be treated with Cipro and Flagyl and discharged to home. She has no white count and is afebrile and nontoxic appearing. Family understands to return her should her symptoms significantly worsen or change.    Nathaneil Canary  Isobelle Tuckett, MD 11/17/14 2355

## 2014-11-17 NOTE — ED Notes (Signed)
Patient transported to CT 

## 2014-11-17 NOTE — Discharge Instructions (Signed)
Cipro and Flagyl as prescribed.  Return to the emergency department if you develop severe abdominal pain, bloody stool, or other new and concerning symptoms.   Colitis Colitis is inflammation of the colon. Colitis can be a short-term or long-standing (chronic) illness. Crohn's disease and ulcerative colitis are 2 types of colitis which are chronic. They usually require lifelong treatment. CAUSES  There are many different causes of colitis, including:  Viruses.  Germs (bacteria).  Medicine reactions. SYMPTOMS   Diarrhea.  Intestinal bleeding.  Pain.  Fever.  Throwing up (vomiting).  Tiredness (fatigue).  Weight loss.  Bowel blockage. DIAGNOSIS  The diagnosis of colitis is based on examination and stool or blood tests. X-rays, CT scan, and colonoscopy may also be needed. TREATMENT  Treatment may include:  Fluids given through the vein (intravenously).  Bowel rest (nothing to eat or drink for a period of time).  Medicine for pain and diarrhea.  Medicines (antibiotics) that kill germs.  Cortisone medicines.  Surgery. HOME CARE INSTRUCTIONS   Get plenty of rest.  Drink enough water and fluids to keep your urine clear or pale yellow.  Eat a well-balanced diet.  Call your caregiver for follow-up as recommended. SEEK IMMEDIATE MEDICAL CARE IF:   You develop chills.  You have an oral temperature above 102 F (38.9 C), not controlled by medicine.  You have extreme weakness, fainting, or dehydration.  You have repeated vomiting.  You develop severe belly (abdominal) pain or are passing bloody or tarry stools. MAKE SURE YOU:   Understand these instructions.  Will watch your condition.  Will get help right away if you are not doing well or get worse. Document Released: 04/23/2004 Document Revised: 06/08/2011 Document Reviewed: 07/19/2009 [facility] Patient Information 2015 [place], [place]. This information is not intended to replace advice given to you by  your health care provider. Make sure you discuss any questions you have with your health care provider.

## 2014-12-13 ENCOUNTER — Ambulatory Visit: Payer: Medicare Other | Admitting: Neurology

## 2017-04-12 ENCOUNTER — Emergency Department (HOSPITAL_BASED_OUTPATIENT_CLINIC_OR_DEPARTMENT_OTHER): Payer: Medicare Other

## 2017-04-12 ENCOUNTER — Encounter (HOSPITAL_BASED_OUTPATIENT_CLINIC_OR_DEPARTMENT_OTHER): Payer: Self-pay | Admitting: *Deleted

## 2017-04-12 ENCOUNTER — Emergency Department (HOSPITAL_BASED_OUTPATIENT_CLINIC_OR_DEPARTMENT_OTHER)
Admission: EM | Admit: 2017-04-12 | Discharge: 2017-04-12 | Disposition: A | Discharge status: Home / Self Care | Payer: Medicare Other | Attending: Emergency Medicine | Admitting: Emergency Medicine

## 2017-04-12 ENCOUNTER — Other Ambulatory Visit: Payer: Self-pay

## 2017-04-12 DIAGNOSIS — Z7982 Long term (current) use of aspirin: Secondary | ICD-10-CM | POA: Insufficient documentation

## 2017-04-12 DIAGNOSIS — K228 Other specified diseases of esophagus: Secondary | ICD-10-CM

## 2017-04-12 DIAGNOSIS — R0989 Other specified symptoms and signs involving the circulatory and respiratory systems: Secondary | ICD-10-CM | POA: Insufficient documentation

## 2017-04-12 DIAGNOSIS — N189 Chronic kidney disease, unspecified: Secondary | ICD-10-CM | POA: Insufficient documentation

## 2017-04-12 DIAGNOSIS — R198 Other specified symptoms and signs involving the digestive system and abdomen: Secondary | ICD-10-CM

## 2017-04-12 DIAGNOSIS — Z79899 Other long term (current) drug therapy: Secondary | ICD-10-CM | POA: Diagnosis not present

## 2017-04-12 DIAGNOSIS — G309 Alzheimer's disease, unspecified: Secondary | ICD-10-CM | POA: Insufficient documentation

## 2017-04-12 DIAGNOSIS — I129 Hypertensive chronic kidney disease with stage 1 through stage 4 chronic kidney disease, or unspecified chronic kidney disease: Secondary | ICD-10-CM | POA: Insufficient documentation

## 2017-04-12 MED ORDER — GI COCKTAIL ~~LOC~~
30.0000 mL | Freq: Once | ORAL | Status: AC
  Administered 2017-04-12: 30 mL via ORAL
  Filled 2017-04-12: qty 30

## 2017-04-12 NOTE — ED Triage Notes (Signed)
She has a pill caught in her throat per husband.

## 2017-04-12 NOTE — ED Provider Notes (Signed)
[facility] EM[facility]GENCY DEPARTMENT Provider Note  CSN: [id number] Arrival date & time: 04/12/17 1501  Chief Complaint(s) No chief complaint on file.  HPI Lonette Stevison is a 84 y.o. female with a history of Alzheimer's with dementia who presents to the emergency department with foreign body sensation in the esophagus.  Patient's husband reports that he gave her a vitamin C pill this morning which she broke in half.  She after swallowing she began complaining that she thought the pill have been stuck in her esophagus.  The discomfort has persisted since onset and is unchanged.  The patient is able to tolerate her secretions and some oral intake including liquid and pudding.  She has had a couple episodes of spitting up secretions.  No apparent alleviating or aggravating factors.  No other associated symptoms including chest pain, shortness of breath, vomiting, abdominal pain.  HPI  Past Medical History Past Medical History:  Diagnosis Date  . Alzheimer disease   . Anxiety   . Atrial fibrillation ([place])   . GERD (gastroesophageal reflux disease)   . Hypercholesteremia   . Hypertension   . Vision abnormalities    Patient Active Problem List   Diagnosis Date Noted  . Vitamin D deficiency 09/11/2014  . Depression 09/11/2014  . CN (constipation) 09/06/2014  . DD (diverticular disease) 09/06/2014  . Dementia 08/22/2014  . Dementia in Alzheimer's disease with late onset 08/22/2014  . Acute diarrhea 07/18/2014  . Abdominal bloating 06/06/2014  . Chronic kidney disease 02/07/2014  . Colon polyp 02/06/2014  . Hemorrhoids, internal 02/06/2014  . Abdominal pain, right lower quadrant 11/15/2013  . D (diarrhea) 11/15/2013  . Keratosis 11/15/2013  . Cervical pain 11/15/2013  . Cephalalgia 11/03/2013  . Anxiety state 05/17/2013  . DDD (degenerative disc disease), lumbosacral 05/17/2013  . Essential (primary) hypertension 05/17/2013  . Hypercholesteremia 05/17/2013  . Anemia, iron  deficiency 05/17/2013  . Fatty tumor 05/17/2013  . Arthralgia of hip or thigh 05/17/2013  . HLD (hyperlipidemia) 02/08/2013  . BP (high blood pressure) 02/08/2013  . Amnesia 01/06/2013  . Menopausal symptom 01/06/2013  . Carpal tunnel syndrome 10/28/2012  . Disturbance of skin sensation 09/22/2012  . Cervical osteoarthritis 09/03/2012  . Clinical depression 09/03/2012  . Disease of liver 09/03/2012  . Colon, diverticulosis 09/03/2012  . Accumulation of fluid in tissues 09/03/2012  . Polypharmacy 09/03/2012  . Acid reflux 09/03/2012  . LBP (low back pain) 09/03/2012  . Lumbosacral spondylosis 09/03/2012  . Malaise and fatigue 09/03/2012  . Goiter, nontoxic, multinodular 09/03/2012  . Arthritis, degenerative 09/03/2012  . Awareness of heartbeats 09/03/2012  . Hypersomnia, persistent 09/03/2012  . Cervical spinal stenosis 09/03/2012  . Fast heart beat 09/03/2012  . Buzzing in ear 09/03/2012  . H/O excision of lamina of lumbar vertebra for decompression of spinal cord 06/15/2012  . DDD (degenerative disc disease), cervical 03/08/2012  . Lumbar canal stenosis 03/08/2012  . Thoracic and lumbosacral neuritis 12/14/2011  . Acquired cyst of kidney 11/27/2011  . Cardiac murmur 09/23/2010   Home Medication(s) Prior to Admission medications   Medication Sig Start Date End Date Taking? Authorizing Provider  amLODipine (NORVASC) 2.5 MG tablet Take 2.5 mg by mouth daily.   Yes [provider]  aspirin EC 81 MG tablet Take 81 mg by mouth. 01/01/10  Yes [provider]  citalopram (CELEXA) 10 MG tablet Take 10 mg by mouth daily.   Yes [provider]  loratadine (CLARITIN) 10 MG tablet Take 10 mg by mouth. 09/26/13  Yes [provider]  LORazepam (ATIVAN) 0.5 MG tablet Take 0.5 mg by mouth every 8 (eight) hours.   Yes [provider]  losartan (COZAAR) 50 MG tablet TAKE 1 BY MOUTH EVERY MORNING FOR BLOOD PRESSURE *STOP LOSARTAN 100MG  1 EVERY MORNING*  07/17/14  Yes [provider]  meloxicam (MOBIC) 7.5 MG tablet Hold medications until at least 12/11/13. 11/15/13  Yes [provider]  memantine (NAMENDA) 5 MG tablet Take 5 mg by mouth. 08/22/14 04/12/17 Yes [provider]  metoprolol tartrate (LOPRESSOR) 25 MG tablet Take 50 mg by mouth. 02/07/14 04/12/17 Yes [provider]  omeprazole (PRILOSEC) 40 MG capsule TAKE 1 CAPSULE EVERY DAY IN THE MORNING 30 MINUTES BEFORE BREAKFAST FOR GERD. 04/11/14 04/12/17 Yes [provider]  pravastatin (PRAVACHOL) 20 MG tablet Take 20 mg by mouth daily.   Yes [provider]  Probiotic Product (PROBIOTIC DAILY PO) Take by mouth.   Yes [provider]  ARTIFICIAL TEARS 0.1-0.3 % SOLN 1 drop.    [provider]  buPROPion (WELLBUTRIN XL) 150 MG 24 hr tablet Take 1 tablet (150 mg total) by mouth daily. 09/20/14   Sater, Nanine Means, MD  ciprofloxacin (CIPRO) 500 MG tablet Take 1 tablet (500 mg total) by mouth 2 (two) times daily. One po bid x 7 days 11/17/14   Veryl Speak, MD  diltiazem [facility]) 300 MG 24 hr capsule Take 300 mg by mouth daily.    [provider]  docusate sodium (COLACE) 100 MG capsule Take 100 mg by mouth.    [provider]  estrogens, conjugated, (PREMARIN) 0.3 MG tablet Take 0.3 mg by mouth daily. Take daily for 21 days then do not take for 7 days.    [provider]  gabapentin (NEURONTIN) 600 MG tablet HOLD starting 02/07/14 until further notice. 02/07/14   [provider]  hydrochlorothiazide (HYDRODIURIL) 25 MG tablet Take 25 mg by mouth daily.    [provider]  metroNIDAZOLE (FLAGYL) 500 MG tablet Take 1 tablet (500 mg total) by mouth 3 (three) times daily. One po bid x 7 days 11/17/14   Veryl Speak, MD  Vitamin D, Ergocalciferol, (DRISDOL) 50000 UNITS CAPS capsule Take 1 capsule (50,000 Units total) by mouth every 7 (seven) days. 09/12/14   Sater, Nanine Means, MD                                                                                                                                     Past Surgical History Past Surgical History:  Procedure Laterality Date  . ABDOMINAL HYSTERECTOMY    . BACK SURGERY    . CATARACT EXTRACTION, BILATERAL    . TUBAL LIGATION     Family History Family History  Problem Relation Age of Onset  . Congestive Heart Failure Mother     Social History Social History   Tobacco Use  . Smoking status: Never Smoker  .  Smokeless tobacco: Never Used  Substance Use Topics  . Alcohol use: No  . Drug use: Not on file   Allergies Demerol [meperidine]; Nebivolol; Nebivolol hcl; Penicillins; and Meloxicam  Review of Systems Review of Systems  Unable to perform ROS: Dementia    Physical Exam Vital Signs  I have reviewed the triage vital signs BP (!) 146/67   Pulse 72   Temp 98.4 F (36.9 C) (Oral)   Resp 18   Ht 5\' 6"  (1.676 m)   Wt 72.6 kg (160 lb)   SpO2 94%   BMI 25.82 kg/m   Physical Exam  Constitutional: She is oriented to person, place, and time. She appears well-developed and well-nourished. No distress.  HENT:  Head: Normocephalic and atraumatic.  Nose: Nose normal.  Eyes: Conjunctivae and EOM are normal. Pupils are equal, round, and reactive to light. Right eye exhibits no discharge. Left eye exhibits no discharge. No scleral icterus.  Neck: Normal range of motion. Neck supple.  Cardiovascular: Normal rate and regular rhythm. Exam reveals no gallop and no friction rub.  No murmur heard. Pulmonary/Chest: Effort normal and breath sounds normal. No stridor. No respiratory distress. She has no rales.  Abdominal: Soft. She exhibits no distension. There is no tenderness.  Musculoskeletal: She exhibits no edema or tenderness.  Neurological: She is alert and oriented to person, place, and time.  Skin: Skin is warm and dry. No rash noted. She is not diaphoretic. No erythema.  Psychiatric: She has a normal mood and  affect.  Vitals reviewed.   ED Results and Treatments Labs (all labs ordered are listed, but only abnormal results are displayed) Labs Reviewed - No data to display                                                                                                                       EKG  EKG Interpretation  Date/Time:    Ventricular Rate:    PR Interval:    QRS Duration:   QT Interval:    QTC Calculation:   R Axis:     Text Interpretation:        Radiology Dg Neck Soft Tissue  Result Date: 04/12/2017 CLINICAL DATA:  84 year old who swallowed a pill at 8 o'clock a.m. this morning and believes it may still be in her throat. EXAM: NECK SOFT TISSUES - 1+ VIEW COMPARISON:  None. FINDINGS: No opaque foreign body in the neck soft tissues. Pharyngeal and upper cervical tracheal airway normal in appearance. Normal prevertebral soft tissues. Normal epiglottis. Degenerative changes involving the mid and lower cervical spine. IMPRESSION: Normal soft tissues of the neck. Electronically Signed   By: Evangeline Dakin M.D.   On: 04/12/2017 18:08   Dg Chest 2 View  Result Date: 04/12/2017 CLINICAL DATA:  Swallowed pill this morning and believes that is stuck in her throat. EXAM: CHEST  2 VIEW COMPARISON:  03/25/2012; 04/02/2011 FINDINGS: Grossly unchanged cardiac silhouette and mediastinal contours with atherosclerotic plaque within the thoracic aorta.  Punctate granuloma is seen within the peripheral aspect of the left mid lung with associated partially calcified left hilar lymph nodes, similar to the 02/2012 examination. Minimal left basilar heterogeneous opacities favored to represent atelectasis. No focal airspace opacities. No pleural effusion or pneumothorax. No evidence of edema. No acute osseus abnormalities. No radiopaque foreign bodies are identified. IMPRESSION: 1. No acute cardiopulmonary disease. Specifically, no radiopaque foreign body. 2. Sequela of prior granulomatous infection as detailed  above, similar to the 02/2012 examination. Electronically Signed   By: Sandi Mariscal M.D.   On: 04/12/2017 18:09   Pertinent labs & imaging results that were available during my care of the patient were reviewed by me and considered in my medical decision making (see chart for details).  Medications Ordered in ED Medications  gi cocktail (Maalox,Lidocaine,Donnatal) (30 mLs Oral Given 04/12/17 1739)                                                                                                                                    Procedures Procedures  (including critical care time)  Medical Decision Making / ED Course I have reviewed the nursing notes for this encounter and the patient's prior records (if available in EHR or on provided paperwork).    Foreign body sensation in the esophagus after ingesting pill.  Patient was provided with [employer] resulting in mild improvement.  She was able to tolerate oral intake and is tolerating her own secretions.  Plain film was obtained and did not reveal any evidence of pneumomediastinum.  No radiopaque foreign body noted.  Additionally patient was given GI cocktail which again provided some mild improvement.  While awaiting film reads and consultation with GI, patient reported significant improvement of her symptoms.  Still able to tolerate oral intake.  Given the contact information to Dr. Ardis Hughs from the lower GI, for follow-up as needed.   The patient appears reasonably screened and/or stabilized for discharge and I doubt any other medical condition or other [facility] requiring further screening, evaluation, or treatment in the ED at this time prior to discharge.  The patient is safe for discharge with strict return precautions.   Final Clinical Impression(s) / ED Diagnoses Final diagnoses:  Sensation of foreign body in esophagus    Disposition: Discharge  Condition: Good  I have discussed the results, Dx and Tx plan with the patient and husband  who expressed understanding and agree(s) with the plan. Discharge instructions discussed at great length. The patient and husband were  given strict return precautions who verbalized understanding of the instructions. No further questions at time of discharge.    ED Discharge Orders    None       Follow Up: Milus Banister, MD [address] [place] [address] [phone]  Schedule an appointment as soon as possible for a visit  if yoy have continued foreign body sensation in throat.  [facility] [address] [id number] [place] [address] [phone]  if you are unable to tolerate oral intake or your own saliva.     This chart was dictated using voice recognition software.  Despite best efforts to proofread,  errors can occur which can change the documentation meaning.   Fatima Blank, MD 04/12/17 1958

## 2017-04-16 ENCOUNTER — Ambulatory Visit: Payer: Medicare Other | Admitting: Gastroenterology

## 2019-03-03 ENCOUNTER — Telehealth: Payer: Self-pay

## 2019-03-03 NOTE — Telephone Encounter (Signed)
Phone call placed to patient's daughter to introduce Palliative Care and to offer to schedule visit with NP. Daughter requested a visit closer to the evening so patient's other daughters can face time into the meeting. Will discuss with Palliative NP on availability and return call to daughter.

## 2019-03-06 ENCOUNTER — Telehealth: Payer: Self-pay

## 2019-03-06 NOTE — Telephone Encounter (Signed)
Spoke with patient's husband, Lanny Hurst,  to schedule visit for Wednesday 03/08/2019 @ 4pm. Lanny Hurst will speak with daughter, Marzetta Board and call back if time does not work.

## 2019-03-06 NOTE — Telephone Encounter (Signed)
Phone call placed to patient's husband to schedule visit with Palliative Care. Daughter had requested an evening visit. Message left stating that NP could see patient on Wednesday 03/08/2019@ 4pm

## 2019-03-08 ENCOUNTER — Other Ambulatory Visit: Payer: Self-pay

## 2019-03-08 ENCOUNTER — Other Ambulatory Visit: Payer: Self-pay | Admitting: Internal Medicine

## 2019-08-03 IMAGING — CR DG NECK SOFT TISSUE
2 series · 2 of 2 positions shown · non-contrast
Comparison: None.

CLINICAL DATA: 85-year-old who swallowed a pill at 8 o'clock a.m.
this morning and believes it may still be in her throat.

EXAM:
NECK SOFT TISSUES - 1+ VIEW

[w soft tissue neck]
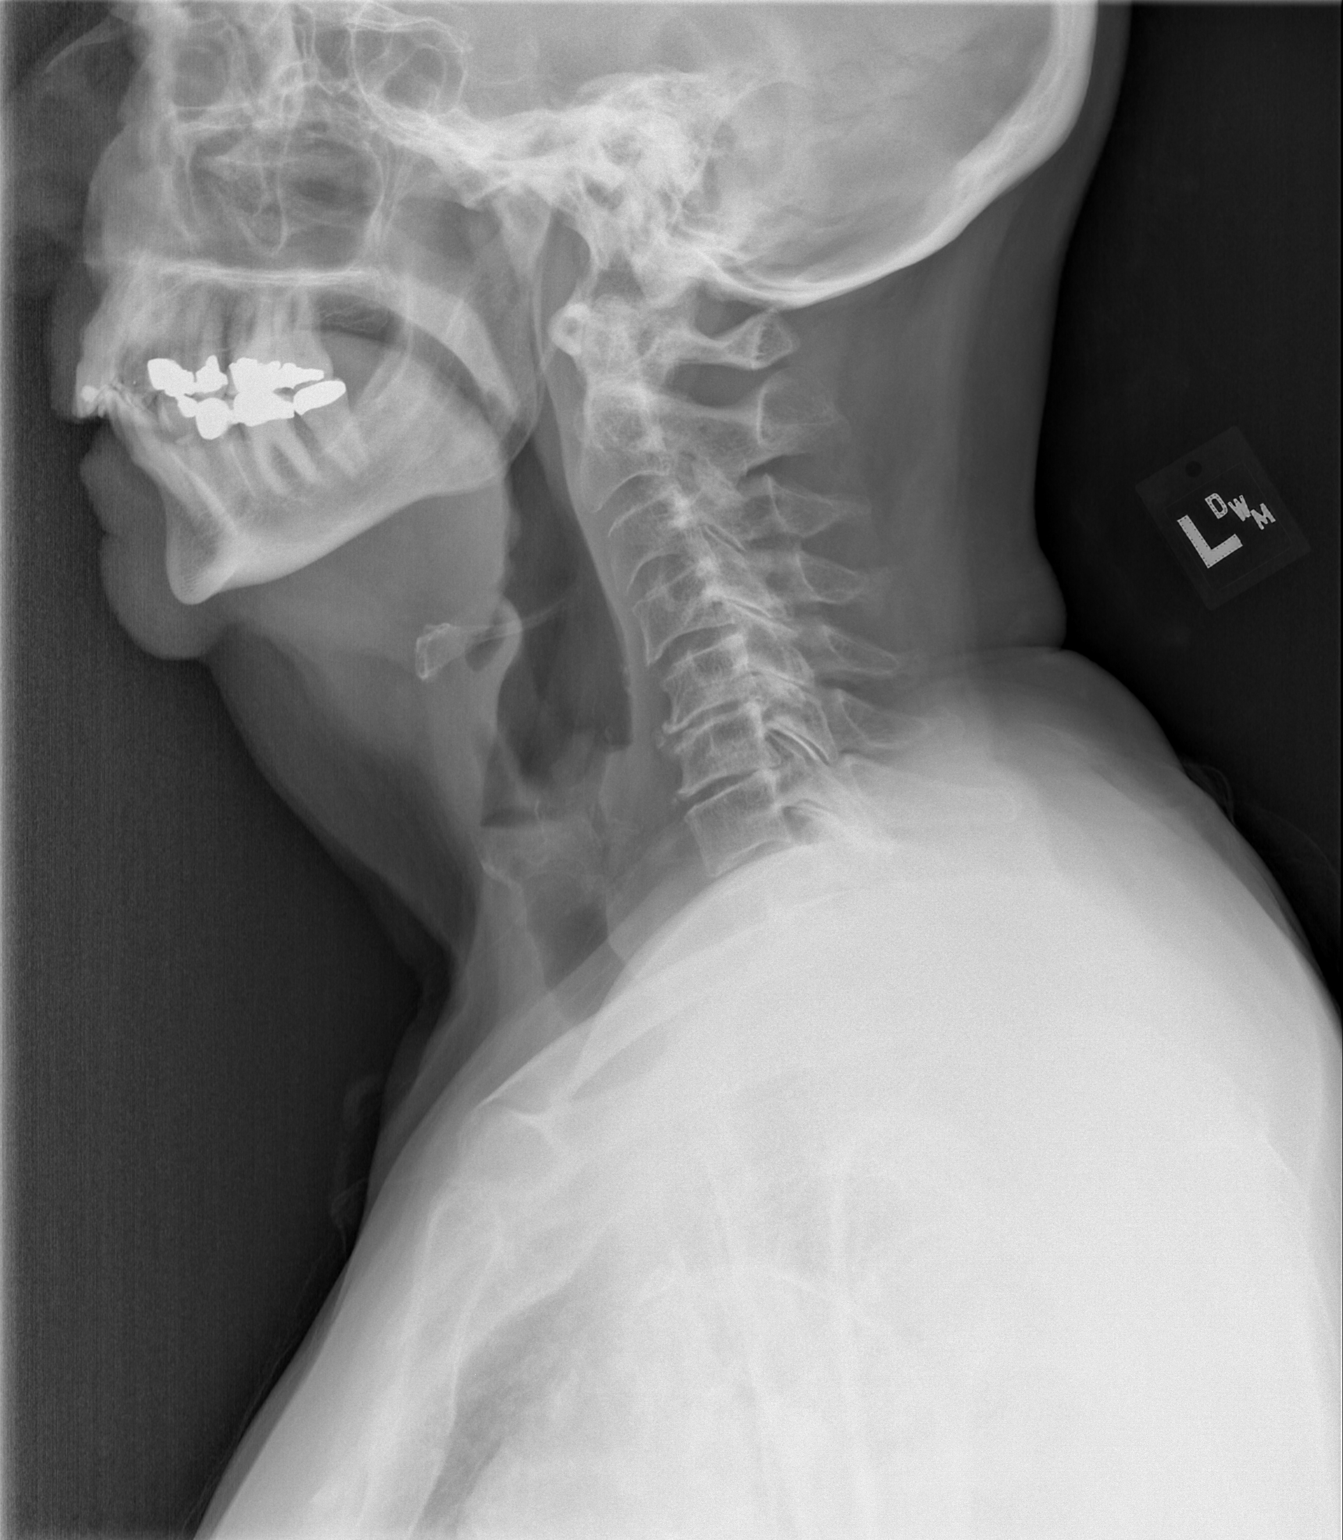

[w soft tissue neck ap]
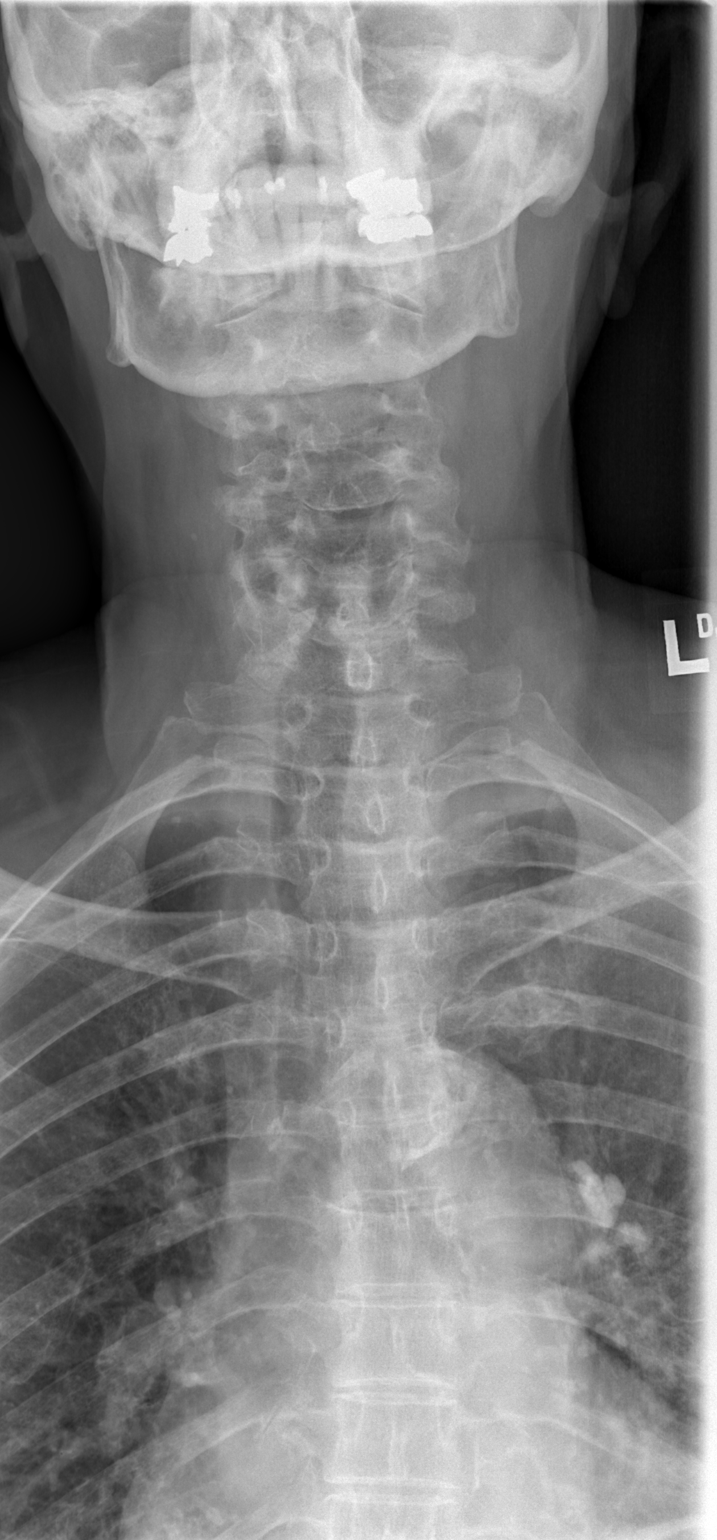

[2 of 2 positions shown; findings below may reference images not displayed]

FINDINGS: No opaque foreign body in the neck soft tissues. Pharyngeal and
upper cervical tracheal airway normal in appearance. Normal
prevertebral soft tissues. Normal epiglottis. Degenerative changes
involving the mid and lower cervical spine.
IMPRESSION: Normal soft tissues of the neck.

## 2019-08-16 ENCOUNTER — Telehealth: Payer: Self-pay

## 2019-08-16 NOTE — Telephone Encounter (Signed)
Volunteer support call for palliative care, message left 

## 2019-11-29 DEATH — deceased
